# Patient Record
Sex: Male | Born: 1970 | Hispanic: No | Marital: Married | Smoking: Never smoker
Health system: Southern US, Community
[De-identification: ages and names within clinical notes are randomized; demographics above are authoritative.]

## PROBLEM LIST (undated history)

## (undated) DIAGNOSIS — I10 Essential (primary) hypertension: Secondary | ICD-10-CM

## (undated) DIAGNOSIS — E785 Hyperlipidemia, unspecified: Secondary | ICD-10-CM

---

## 1998-01-25 ENCOUNTER — Other Ambulatory Visit: Admission: RE | Admit: 1998-01-25 | Discharge: 1998-01-25 | Payer: Self-pay | Admitting: Dermatology

## 1998-02-21 ENCOUNTER — Other Ambulatory Visit: Admission: RE | Admit: 1998-02-21 | Discharge: 1998-02-21 | Payer: Self-pay | Admitting: Dermatology

## 2013-01-12 ENCOUNTER — Ambulatory Visit
Admission: RE | Admit: 2013-01-12 | Discharge: 2013-01-12 | Disposition: A | Discharge status: Home / Self Care | Payer: BC Managed Care – PPO | Source: Ambulatory Visit | Attending: *Deleted | Admitting: *Deleted

## 2013-01-12 ENCOUNTER — Other Ambulatory Visit: Payer: Self-pay | Admitting: *Deleted

## 2013-01-12 DIAGNOSIS — R05 Cough: Secondary | ICD-10-CM

## 2015-03-09 ENCOUNTER — Emergency Department (HOSPITAL_BASED_OUTPATIENT_CLINIC_OR_DEPARTMENT_OTHER)
Admission: EM | Admit: 2015-03-09 | Discharge: 2015-03-10 | Disposition: A | Discharge status: Home / Self Care | Payer: PRIVATE HEALTH INSURANCE | Attending: Emergency Medicine | Admitting: Emergency Medicine

## 2015-03-09 ENCOUNTER — Emergency Department (HOSPITAL_BASED_OUTPATIENT_CLINIC_OR_DEPARTMENT_OTHER): Payer: PRIVATE HEALTH INSURANCE

## 2015-03-09 ENCOUNTER — Encounter (HOSPITAL_BASED_OUTPATIENT_CLINIC_OR_DEPARTMENT_OTHER): Payer: Self-pay

## 2015-03-09 DIAGNOSIS — R05 Cough: Secondary | ICD-10-CM | POA: Insufficient documentation

## 2015-03-09 DIAGNOSIS — I10 Essential (primary) hypertension: Secondary | ICD-10-CM | POA: Diagnosis not present

## 2015-03-09 DIAGNOSIS — R109 Unspecified abdominal pain: Secondary | ICD-10-CM | POA: Diagnosis not present

## 2015-03-09 DIAGNOSIS — R509 Fever, unspecified: Secondary | ICD-10-CM | POA: Diagnosis present

## 2015-03-09 DIAGNOSIS — R197 Diarrhea, unspecified: Secondary | ICD-10-CM | POA: Diagnosis not present

## 2015-03-09 DIAGNOSIS — E785 Hyperlipidemia, unspecified: Secondary | ICD-10-CM | POA: Diagnosis not present

## 2015-03-09 DIAGNOSIS — R110 Nausea: Secondary | ICD-10-CM | POA: Diagnosis not present

## 2015-03-09 HISTORY — DX: Essential (primary) hypertension: I10

## 2015-03-09 HISTORY — DX: Hyperlipidemia, unspecified: E78.5

## 2015-03-09 LAB — URINALYSIS, ROUTINE W REFLEX MICROSCOPIC
Bilirubin Urine: NEGATIVE
Glucose, UA: NEGATIVE mg/dL
Hgb urine dipstick: NEGATIVE
KETONES UR: 15 mg/dL — AB
LEUKOCYTES UA: NEGATIVE
NITRITE: NEGATIVE
Protein, ur: NEGATIVE mg/dL
Specific Gravity, Urine: 1.019 (ref 1.005–1.030)
UROBILINOGEN UA: 0.2 mg/dL (ref 0.0–1.0)
pH: 6.5 (ref 5.0–8.0)

## 2015-03-09 LAB — CBC WITH DIFFERENTIAL/PLATELET
BASOS ABS: 0 10*3/uL (ref 0.0–0.1)
Basophils Relative: 0 % (ref 0–1)
EOS ABS: 0 10*3/uL (ref 0.0–0.7)
Eosinophils Relative: 0 % (ref 0–5)
HCT: 41 % (ref 39.0–52.0)
HEMOGLOBIN: 14.5 g/dL (ref 13.0–17.0)
LYMPHS ABS: 0.5 10*3/uL — AB (ref 0.7–4.0)
LYMPHS PCT: 6 % — AB (ref 12–46)
MCH: 31.7 pg (ref 26.0–34.0)
MCHC: 35.4 g/dL (ref 30.0–36.0)
MCV: 89.5 fL (ref 78.0–100.0)
Monocytes Absolute: 0.7 10*3/uL (ref 0.1–1.0)
Monocytes Relative: 9 % (ref 3–12)
NEUTROS PCT: 85 % — AB (ref 43–77)
Neutro Abs: 6 10*3/uL (ref 1.7–7.7)
PLATELETS: 168 10*3/uL (ref 150–400)
RBC: 4.58 MIL/uL (ref 4.22–5.81)
RDW: 12.4 % (ref 11.5–15.5)
WBC: 7.1 10*3/uL (ref 4.0–10.5)

## 2015-03-09 LAB — COMPREHENSIVE METABOLIC PANEL
ALBUMIN: 3.9 g/dL (ref 3.5–5.0)
ALT: 19 U/L (ref 17–63)
ANION GAP: 11 (ref 5–15)
AST: 27 U/L (ref 15–41)
Alkaline Phosphatase: 56 U/L (ref 38–126)
BILIRUBIN TOTAL: 0.8 mg/dL (ref 0.3–1.2)
BUN: 12 mg/dL (ref 6–20)
CALCIUM: 8.8 mg/dL — AB (ref 8.9–10.3)
CO2: 23 mmol/L (ref 22–32)
Chloride: 98 mmol/L — ABNORMAL LOW (ref 101–111)
Creatinine, Ser: 0.93 mg/dL (ref 0.61–1.24)
GFR calc non Af Amer: >60 mL/min (ref >60)
Glucose, Bld: 114 mg/dL — ABNORMAL HIGH (ref 65–99)
Potassium: 3.7 mmol/L (ref 3.5–5.1)
SODIUM: 132 mmol/L — AB (ref 135–145)
TOTAL PROTEIN: 6.4 g/dL — AB (ref 6.5–8.1)

## 2015-03-09 MED ORDER — IBUPROFEN 800 MG PO TABS
800.0000 mg | ORAL_TABLET | Freq: Once | ORAL | Status: AC
  Administered 2015-03-09: 800 mg via ORAL
  Filled 2015-03-09: qty 1

## 2015-03-09 MED ORDER — SODIUM CHLORIDE 0.9 % IV BOLUS (SEPSIS)
1000.0000 mL | Freq: Once | INTRAVENOUS | Status: AC
  Administered 2015-03-09: 1000 mL via INTRAVENOUS

## 2015-03-09 MED ORDER — ACETAMINOPHEN 500 MG PO TABS
1000.0000 mg | ORAL_TABLET | Freq: Once | ORAL | Status: AC
Start: 2015-03-09 — End: 2015-03-10
  Administered 2015-03-10: 1000 mg via ORAL
  Filled 2015-03-09: qty 2

## 2015-03-09 MED ORDER — SODIUM CHLORIDE 0.9 % IV BOLUS (SEPSIS)
1000.0000 mL | Freq: Once | INTRAVENOUS | Status: AC
  Administered 2015-03-10: 1000 mL via INTRAVENOUS

## 2015-03-09 NOTE — ED Notes (Addendum)
Pt reports diarrhea, feeling tired, fatigued, muscle tremors, fever x1 day. Pt reports he recently traveled back from [place] today after 4 days. Reports insect bite to left foot. Pt reports Tylenol 2 hours ago, Motrin 4 hours ago.

## 2015-03-09 NOTE — Discharge Instructions (Signed)
Fever, Adult °A fever is a higher than normal body temperature. In an adult, an oral temperature around 98.6° F (37° C) is considered normal. A temperature of 100.4° F (38° C) or higher is generally considered a fever. Mild or moderate fevers generally have no long-term effects and often do not require treatment. Extreme fever (greater than or equal to 106° F or 41.1° C) can cause seizures. The sweating that may occur with repeated or prolonged fever may cause dehydration. Elderly people can develop confusion during a fever. °A measured temperature can vary with: °· Age. °· Time of day. °· Method of measurement (mouth, underarm, rectal, or ear). °The fever is confirmed by taking a temperature with a thermometer. Temperatures can be taken different ways. Some methods are accurate and some are not. °· An oral temperature is used most commonly. Electronic thermometers are fast and accurate. °· An ear temperature will only be accurate if the thermometer is positioned as recommended by the manufacturer. °· A rectal temperature is accurate and done for those adults who have a condition where an oral temperature cannot be taken. °· An underarm (axillary) temperature is not accurate and not recommended. °Fever is a symptom, not a disease.  °CAUSES  °· Infections commonly cause fever. °· Some noninfectious causes for fever include: °· Some arthritis conditions. °· Some thyroid or adrenal gland conditions. °· Some immune system conditions. °· Some types of cancer. °· A medicine reaction. °· High doses of certain street drugs such as methamphetamine. °· Dehydration. °· Exposure to high outside or room temperatures. °· Occasionally, the source of a fever cannot be determined. This is sometimes called a "fever of unknown origin" (FUO). °· Some situations may lead to a temporary rise in body temperature that may go away on its own. Examples are: °· Childbirth. °· Surgery. °· Intense exercise. °HOME CARE INSTRUCTIONS  °· Take  appropriate medicines for fever. Follow dosing instructions carefully. If you use acetaminophen to reduce the fever, be careful to avoid taking other medicines that also contain acetaminophen. Do not take aspirin for a fever if you are younger than age 19. There is an association with Reye's syndrome. Reye's syndrome is a rare but potentially deadly disease. °· If an infection is present and antibiotics have been prescribed, take them as directed. Finish them even if you start to feel better. °· Rest as needed. °· Maintain an adequate fluid intake. To prevent dehydration during an illness with prolonged or recurrent fever, you may need to drink extra fluid. Drink enough fluids to keep your urine clear or pale yellow. °· Sponging or bathing with room temperature water may help reduce body temperature. Do not use ice water or alcohol sponge baths. °· Dress comfortably, but do not over-bundle. °SEEK MEDICAL CARE IF:  °· You are unable to keep fluids down. °· You develop vomiting or diarrhea. °· You are not feeling at least partly better after 3 days. °· You develop new symptoms or problems. °SEEK IMMEDIATE MEDICAL CARE IF:  °· You have shortness of breath or trouble breathing. °· You develop excessive weakness. °· You are dizzy or you faint. °· You are extremely thirsty or you are making little or no urine. °· You develop new pain that was not there before (such as in the head, neck, chest, back, or abdomen). °· You have persistent vomiting and diarrhea for more than 1 to 2 days. °· You develop a stiff neck or your eyes become sensitive to light. °· You develop a   skin rash.  You have a fever or persistent symptoms for more than 2 to 3 days.  You have a fever and your symptoms suddenly get worse. MAKE SURE YOU:   Understand these instructions.  Will watch your condition.  Will get help right away if you are not doing well or get worse. Document Released: 01/09/2001 Document Revised: 11/30/2013 Document  Reviewed: 05/17/2011 [facility] Patient Information 2015 [place], [place]. This information is not intended to replace advice given to you by your health care provider. Make sure you discuss any questions you have with your health care provider. Traveling, Food and Drink Risks Unclean or not pure (contaminated) food and drink are common sources for bringing infection into the body, especially the digestive system (gastroenteritis). This can cause nausea, stomach cramping, diarrhea (with or without visible blood) and/or vomiting. Some of the common infections that travelers can get are:  Escherichia coli infections (E. coli).  Shigellosis or bacillary dysentery.  Giardiasis.  Campylobacter jejuni infections.  Cryptosporidiosis.  Hepatitis A.  Amebiasis. Other less common infectious disease risks for travelers include:  Typhoid fever and other salmonelloses.  Cholera.  Viral infections caused by rotavirus and noroviruses.  Various protozoan and helminthic (worm-like) parasites. Many of the infectious diseases transmitted in food and water can also be acquired when solid body wastes, or feces, contaminate food (fecal-oral route). WHAT IS TRAVELERS' DIARRHEA? The most common travel health problem is travelers' diarrhea. Between 20% and 50% of international travelers develop diarrhea each year. It often occurs in the first week of travel. However, it may occur at any time while traveling, or after returning home. The world is divided into three grades of risk:  Low-risk: [place], [place], [place], [place], [place], countries in [place] and [place].  Intermediate-risk: [place], [place], some of the [place] islands.  High-risk: Most of [place], [place], [place], [place], [place] and [place]. In high risk places, often many people do not have access to plumbing or outhouses. The amount of contaminated stool is high and more open to flies. Poor electricity  capacity can cause blackouts. This affects refrigeration, which may cause unsafe food storage. Lack of water supplies may mean a lack of sinks for hand washing by restaurant staff. People at greater risk include young adults, and those with low immune response. This includes people with HIV/AIDS, or those taking medicines to decrease immunity. Also, people with inflammatory-bowel disease or diabetes, and people taking stomach medicines that reduce acid level (H-2 blockers or antacids). The leading cause of infection is consuming food or water contaminated with feces. Poor hygiene practice in local restaurants is thought to be the largest cause of travelers' diarrhea. The bacteria or germ that most often causes this illness is E. coli. WHAT ARE THE SYMPTOMS OF TRAVELERS' DIARRHEA? The illness often begins suddenly. It causes an increase in frequency, volume, and weight of stool. An affected person often has 4 to 5 loose, watery bowel movements each day. Other symptoms include nausea, vomiting, diarrhea, stomach cramping, bloating, fever, urgency, and general ill feeling (malaise). Most cases are not serious and go away in 1-2 days without treatment. Travelers' diarrhea is rarely life-threatening. (90% of cases resolve in 1 week. 98% resolve in 1 month.) PREVENTING TRAVELERS' DIARRHEA Reduce your exposure to potentially not pure water. Water that is chlorinated, using minimum water treatment standards used in the [place] protects against viral and bacterial (germs) diseases. Chlorine treatment alone might not kill some enteric (gut) viruses. It may not kill  all infection causing parasites. Where chlorinated tap water is not available, or where hygiene and sanitation are poor, only the following might be safe to drink:   Beverages made with boiled water (tea, coffee).  Canned or bottled carbonated drinks (carbonated bottled water, soft drinks).  Beer.  Wine. When buying carbonated drinks or bottled water,  always inspect the bottle seal. Make sure it has not been opened. This could mean it was refilled with unclean beverages. If you suspect a bottle seal has been tampered with, return or discard it.  Where water might be contaminated, ice could be, also. Ice should not be used in beverages. If ice comes in contact with containers used for drinking, discard the ice. Thoroughly clean the containers, preferably with soap and hot water.  It is safer to drink a beverage directly from the can or bottle than from a questionable container. However, water on the outside of cans or bottles might not be pure. Dry wet cans or bottles before they are opened. Wipe clean the surfaces where your mouth will have contact. Also, avoid brushing your teeth with tap water.  PREVENTIVE TREATMENT OF WATER  The following methods can be used to treat water. This makes it safe for drinking and other purposes.   Boiling. This is the best method. Bring water to a rolling boil for 1 minute. Then allow it to cool to room temperature. Ice should not be added. Boiling will kill bacterial and parasitic causes of diarrhea at all altitudes. It will kill viruses at low altitudes. To kill viruses at altitudes above 2,000 meters (6,562 feet), water should be boiled for 3 minutes. Or chemical disinfection should be used after the water has boiled for 1 minute. To improve taste, add a pinch of salt to each quart. Or pour the water several times from one clean container to another.  Chemical disinfection. Iodine can be used, when boiling is not possible. However, this method might not kill all parasites, unless the water sits for 15 hours before it is drunk. Two well-tested methods for disinfection with iodine are the use of:  Tincture of iodine.  Tetraglycine hydroperiodide tablets. Examples are Globaline, Potable-Aqua, or Coghlan's. You can find these tablets at pharmacies and sporting goods stores. Follow the instructions on the label. If  water is cloudy, double the number of tablets used. If water is extremely cold (less than 5 Celsius or 41 Fahrenheit), try to warm it. Increase the advised contact time to achieve reliable disinfection. Cloudy water should be strained through a clean cloth into a container. This should remove floating matter. Then the water should be boiled. Or it can be treated with iodine. Chlorine, in various forms, can also be used for chemical disinfection. But its germ killing activity varies greatly with the acidity (pH), temperature, and content of the water. Chemically treated water is meant for short-term use only. Use iodine disinfected water for only a few weeks.   Portable filters provide various degrees of protection. Reverse-osmosis filters protect against viruses, bacteria (germs), and protozoa. However, they are expensive and large. The small pores on this type of filter get quickly plugged by muddy or cloudy water. The membranes in some filters can be damaged by chlorine. Microstrainer filters (0.1- to 0.3-micrometers), can remove bacteria and protozoa. But they do not remove viruses. To kill viruses, travelers should disinfect the water with iodine or chlorine after filtration. Filters with iodine-impregnated resins work best against bacteria. The iodine will kill some viruses. But the  contact time with the iodine in the filter is too short to kill some parasites. To produce safe water, proper selection, operation, care, and maintenance of water filters is needed. Follow the instructions on the label.  As a last resort, if safe drinking water is not available, very hot tap water might be safer than cold tap water. But proper disinfection, filtering, or boiling is still advised. REDUCING RISK FROM FOOD  Reduce your exposure to potentially contaminated food. To avoid illness, travelers should select food with care. All raw food could be contaminated. Especially where hygiene and sanitation are poor,  avoid:  Salads.  Uncooked vegetables.  Unpeeled fruits or vegetables.  Unpasteurized milk and milk products, such as cheese.  Undercooked and raw meat, fish, and shellfish.  Eat only food that has been cooked and is still hot.  Eat fruit that has been prepared by a food service provider who routinely caters to foreign travelers.  Cooked food that stands for hours at room temperature can have bacterial growth. It should be thoroughly reheated before serving. Avoid foods that may have been sitting for some time in the kitchen or in a buffet.  Do not eat food purchased from street vendors. Consuming food and beverages purchased from street vendors has been linked to increased risk of illness.  Eat at restaurants that often cater to foreign travelers (leading hotels, hotel chains).  To guarantee safe food for an infant younger than 37 months of age, breastfeed. If the infant has already been weaned from the breast, formula prepared from commercial powder and boiled water is the safest food.  Carry small containers of hand-sanitizing solutions or gels (with at least 60% alcohol). This makes it easier to clean your hands before eating.  Some fish and shellfish contain poisonous biotoxins (such as ciguatoxin), even when cooked. [place] is the most toxin filled. It should always be avoided. Red snapper, grouper, amber jack, sea bass, and other tropical reef fish contain the toxin at various times. Poisoning potential exists in all areas where those fish are eaten. Especially in subtropical and [address] areas of the [place], [place], and [place]. Symptoms of this poisoning include gastroenteritis. That is followed by:  Neurologic problems, such as dysesthesias (impaired senses, especially touch).  Temperature reversal.  Weakness.  Rarely, low blood pressure (hypotension).  Scombroid is another common fish poisoning. It occurs worldwide in tropical and temperate regions.  Fish of the [place] family (bluefin, yellow fin tuna, mackerel, bonito), and some non-scombroid fish (mahi [place], herring, amber [place], [place]) may contain high levels of histidine. With poor refrigeration or preservation, histidine turns into histamine. This can cause:  Flushing (becoming red faced).  Headache.  Feeling sick to your stomach (nausea).  Vomiting.  Diarrhea.  Hives (urticaria). Cholera has occurred in people who ate crab brought back from [place]. Travelers should not bring perishable seafood with them when they return to the [place] TREATMENT OF TRAVELERS' DIARRHEA  This illness often goes away without treatment. Oral rehydration (giving the body safe water and added packets of salt and sugar mixtures) can replace lost fluids and chemicals in the blood (electrolytes). This is especially important in children and people with longstanding (chronic) diseases. For severe fluid loss, the best replacement is with oral rehydration solutions (ORS), such as the WHO ORS solutions. These are widely available at stores and pharmacies in most developing countries.  When symptoms first occur, stop eating, and only drink clear liquids (only for adults) to help quiet  the stomach. Travelers who develop 3 or more loose stools in an 8-hour period may benefit from antibiotic medicine. Especially if you also have nausea, vomiting, stomach cramps, fever, or blood in stools. Antibiotics are often given for 3-5 days. Some caregivers will prescribe antibiotics for people who are traveling to high risk places, to be used if and when symptoms begin. If diarrhea continues despite treatment, travelers should be evaluated by a caregiver and treated for possible parasitic infection. Control of frequent diarrhea is possible with over-the-counter medicines called anti-motility agents. These drugs reduce the amount of diarrhea by slowing transit time in the stomach. This allows more time for food and water to  be absorbed. It is thought that diarrhea may be a defense mechanism the body uses, to reduce the contact time between infection causing germs and the stomach. Anti-motility drugs decrease the duration of diarrhea. But they should never be used by travelers with fever or bloody diarrhea. Such drugs can increase the severity of disease by slowing the removal of germs from the body. If excessively or not properly taken, anti-motility agents can cause serious illness on their own. Studies have found that people who follow the above eating rules still get ill sometimes. Antibiotic prevention before infection occurs is not advised for most travelers. Exceptions include those who have a high risk of serious health problems if treated after infection. However, studies have shown that the active ingredient in Pepto-Bismol (bismuth subsalicylate, BSS), taken regularly while traveling, can offer some protection against travelers' diarrhea. Ask your caregiver about this option before traveling, if desired. Do not use in children. This Information Courtesy of CDC. Document Released: 10/06/2002 Document Revised: 10/08/2011 Document Reviewed: 10/23/2013 [employer] Patient Information 2015 [place], [place]. This information is not intended to replace advice given to you by your health care provider. Make sure you discuss any questions you have with your health care provider.

## 2015-03-09 NOTE — ED Provider Notes (Signed)
CSN: [id number]     Arrival date & time 03/09/15  2144 History   First MD Initiated Contact with Patient 03/09/15 2202     Chief Complaint  Patient presents with  . Insect Bite     (Consider location/radiation/quality/duration/timing/severity/associated sxs/prior Treatment) Patient is a 44 y.o. male presenting with general illness.  Illness Location:  Generalized Quality:  Fatigue, malaise, fever, diarrhea Severity:  Moderate Onset quality:  Gradual Duration:  1 day Timing:  Constant Progression:  Unchanged Chronicity:  New Context:  Return from [place] today, had respiratory illness prior to travel to [place] Relieved by:  Nothing Worsened by:  Nothing Associated symptoms: abdominal pain (crampy with BM), cough, diarrhea, fever and nausea   Associated symptoms: no chest pain, no headaches, no rhinorrhea, no shortness of breath and no vomiting     Past Medical History  Diagnosis Date  . Hypertension   . Hyperlipidemia    History reviewed. No pertinent past surgical history. History reviewed. No pertinent family history. Social History  Substance Use Topics  . Smoking status: Never Smoker   . Smokeless tobacco: None  . Alcohol Use: No    Review of Systems  Constitutional: Positive for fever.  HENT: Negative for rhinorrhea.   Respiratory: Positive for cough. Negative for shortness of breath.   Cardiovascular: Negative for chest pain.  Gastrointestinal: Positive for nausea, abdominal pain (crampy with BM) and diarrhea. Negative for vomiting.  Neurological: Negative for headaches.  All other systems reviewed and are negative.     Allergies  Review of patient's allergies indicates no known allergies.  Home Medications   Prior to Admission medications   Medication Sig Start Date End Date Taking? Authorizing Provider  LISINOPRIL-HYDROCHLOROTHIAZIDE PO Take by mouth.   Yes Historical Provider, MD  PRAVASTATIN SODIUM PO Take by mouth.   Yes Historical Provider, MD   BP  111/85 mmHg  Pulse 111  Temp(Src) 103 F (39.4 C) (Oral)  Resp 16  Ht 6\' 2"  (1.88 m)  Wt 184 lb 4.8 oz (83.598 kg)  BMI 23.65 kg/m2  SpO2 98% Physical Exam  Constitutional: He is oriented to person, place, and time. He appears well-developed and well-nourished.  HENT:  Head: Normocephalic and atraumatic.  Eyes: Conjunctivae and EOM are normal.  Neck: Normal range of motion. Neck supple.  Cardiovascular: Normal rate, regular rhythm and normal heart sounds.   Pulmonary/Chest: Effort normal and breath sounds normal. No respiratory distress.  Abdominal: He exhibits no distension. There is no tenderness. There is no rebound and no guarding.  Musculoskeletal: Normal range of motion.  Neurological: He is alert and oriented to person, place, and time.  Skin: Skin is warm and dry.  Vitals reviewed.   ED Course  Procedures (including critical care time) Labs Review Labs Reviewed  CBC WITH DIFFERENTIAL/PLATELET - Abnormal; Notable for the following:    Neutrophils Relative % 85 (*)    Lymphocytes Relative 6 (*)    Lymphs Abs 0.5 (*)    All other components within normal limits  COMPREHENSIVE METABOLIC PANEL - Abnormal; Notable for the following:    Sodium 132 (*)    Chloride 98 (*)    Glucose, Bld 114 (*)    Calcium 8.8 (*)    Total Protein 6.4 (*)    All other components within normal limits  URINALYSIS, ROUTINE W REFLEX MICROSCOPIC (NOT AT [facility])    Imaging Review No results found.   EKG Interpretation None      MDM   Final diagnoses:  Fever, unspecified fever cause    44 y.o. male with pertinent PMH of HTN presents with fever, cough, diarrhea, nausea in context of recent travel to [place]. Patient denies chest pain or shortness of breath. In [place] he was bit by an insect in his shoe, no signs of infection or trauma of the foot. Unable to see where the insect bit him. He also was exposed to multiple flying insects but does not remember specific bite. On arrival today vitals  signs and physical exam as above. Patient mildly tachycardic, however also febrile. He has no rash, is well-appearing. Workup obtained as above, no elevation in white blood cell count or other abnormalities. Will check chest x-ray and CMP. Doubt malaria as it does not appear prevalent in [place]. Similarly doubt this is a virus or chikungunya given that the patient has no other symptoms.  Do not feel treatment warranted for traveler's diarrhea given one day of symptoms and otherwise well-appearing patient in area that should not be endemic with same.  Patient care to Dr. Read Drivers pending return of labs..    I have reviewed all laboratory and imaging studies if ordered as above  1. Fever, unspecified fever cause         Mirian Mo, MD 03/09/15 [phone]

## 2015-03-10 MED ORDER — ONDANSETRON HCL 4 MG/2ML IJ SOLN
4.0000 mg | Freq: Once | INTRAMUSCULAR | Status: AC
  Administered 2015-03-10: 4 mg via INTRAVENOUS
  Filled 2015-03-10: qty 2

## 2015-03-10 NOTE — ED Notes (Signed)
Pt ambulating independently w/ steady gait on d/c in no acute distress, A&Ox4.D/c instructions reviewed w/ pt and family - pt and family deny any further questions or concerns at present.  

## 2016-03-06 ENCOUNTER — Encounter: Payer: Self-pay | Admitting: Internal Medicine

## 2016-03-06 ENCOUNTER — Other Ambulatory Visit: Payer: Self-pay | Admitting: Family Medicine

## 2016-03-06 DIAGNOSIS — R1310 Dysphagia, unspecified: Secondary | ICD-10-CM

## 2016-03-14 ENCOUNTER — Ambulatory Visit
Admission: RE | Admit: 2016-03-14 | Discharge: 2016-03-14 | Disposition: A | Discharge status: Home / Self Care | Payer: PRIVATE HEALTH INSURANCE | Source: Ambulatory Visit | Attending: Family Medicine | Admitting: Family Medicine

## 2016-03-14 DIAGNOSIS — R1310 Dysphagia, unspecified: Secondary | ICD-10-CM

## 2016-05-16 ENCOUNTER — Ambulatory Visit: Payer: PRIVATE HEALTH INSURANCE | Admitting: Internal Medicine

## 2019-12-30 ENCOUNTER — Other Ambulatory Visit: Payer: Self-pay | Admitting: Family Medicine

## 2019-12-30 ENCOUNTER — Other Ambulatory Visit: Payer: Self-pay

## 2019-12-30 ENCOUNTER — Ambulatory Visit
Admission: RE | Admit: 2019-12-30 | Discharge: 2019-12-30 | Disposition: A | Discharge status: Home / Self Care | Payer: BC Managed Care – PPO | Source: Ambulatory Visit | Attending: Family Medicine | Admitting: Family Medicine

## 2019-12-30 DIAGNOSIS — R059 Cough, unspecified: Secondary | ICD-10-CM

## 2020-10-22 IMAGING — CR DG CHEST 2V
2 series · 2 of 2 positions shown · non-contrast
Comparison: 03/09/2015

CLINICAL DATA: Cough for 4 months,  hypertension

EXAM:
CHEST - 2 VIEW

[w chest pa]
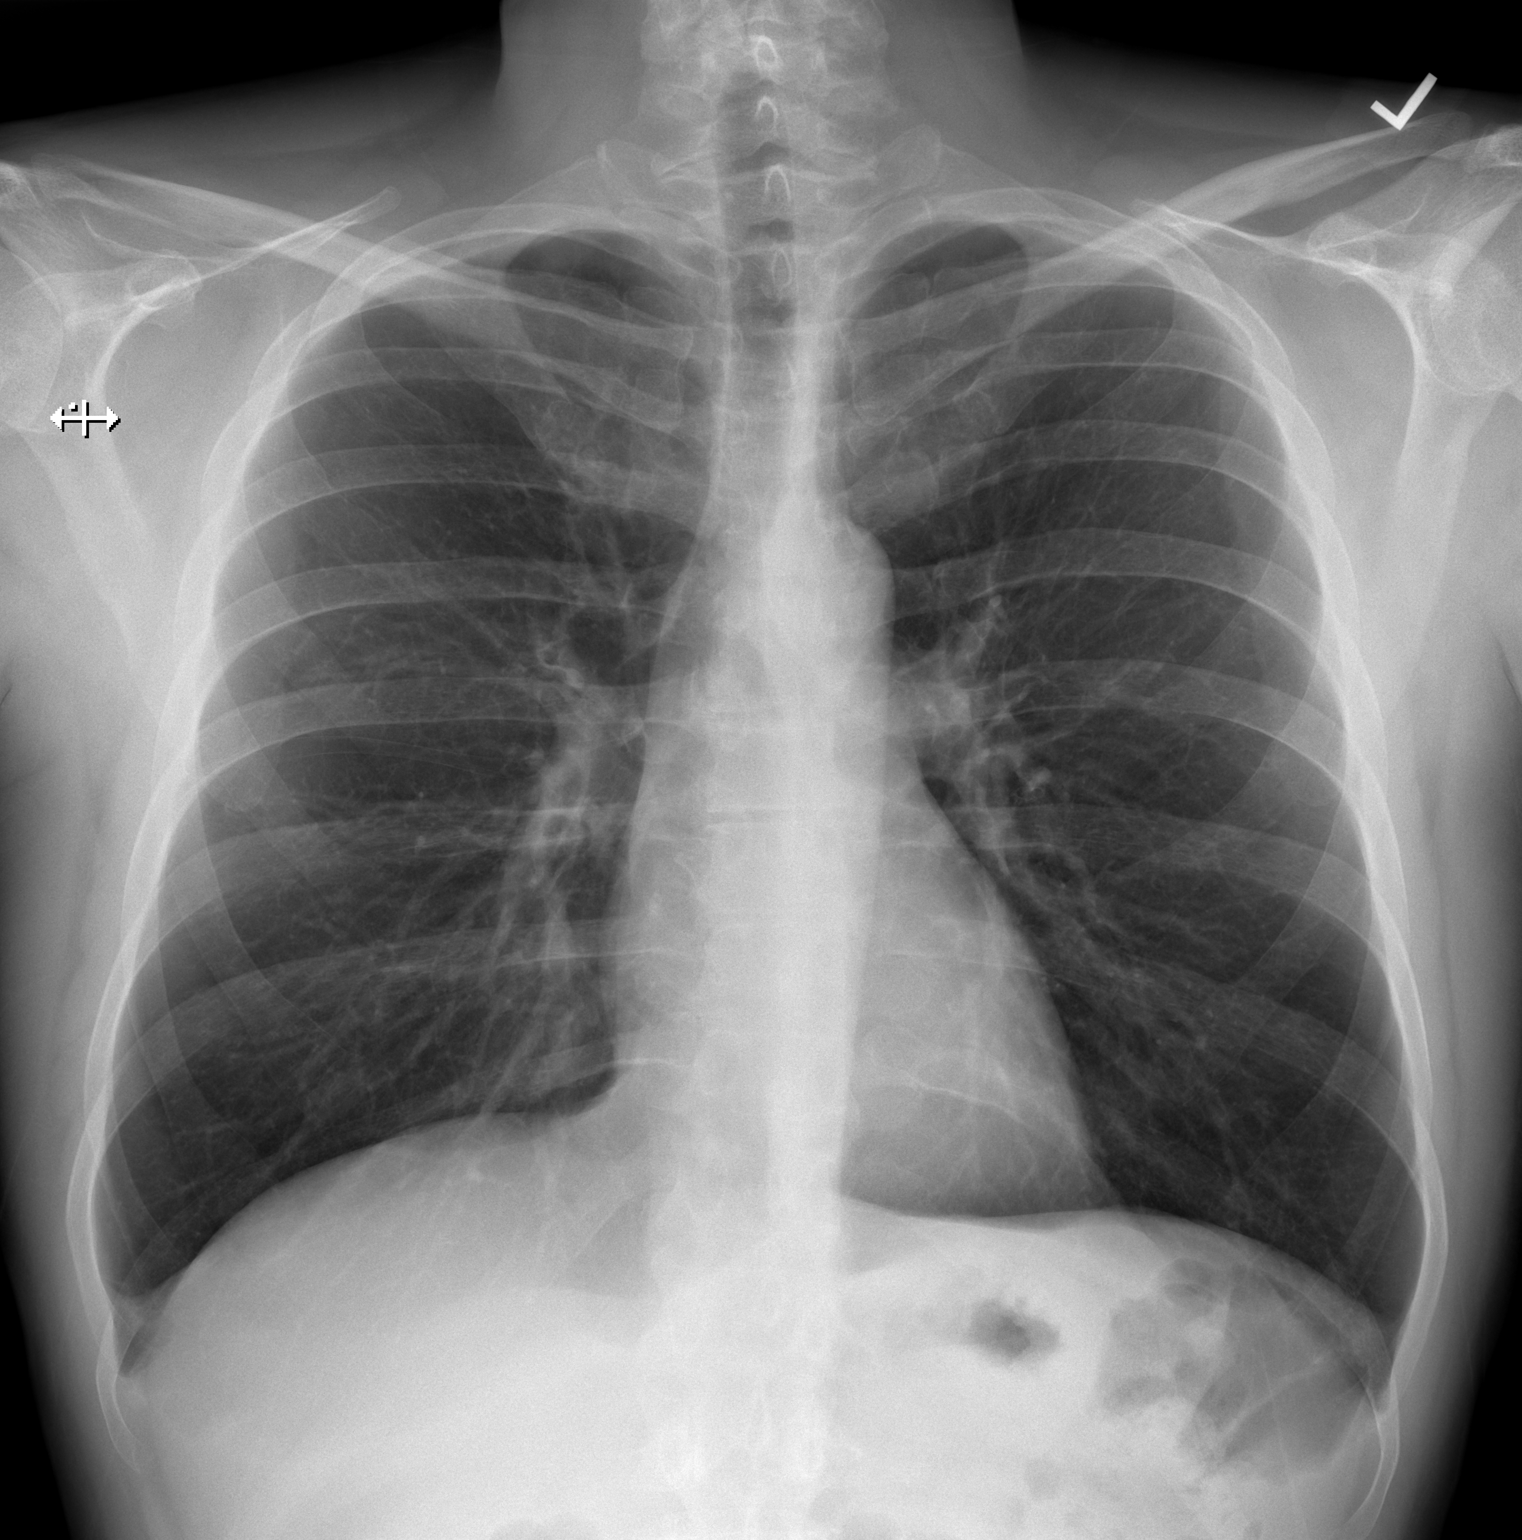

[w chest lat]
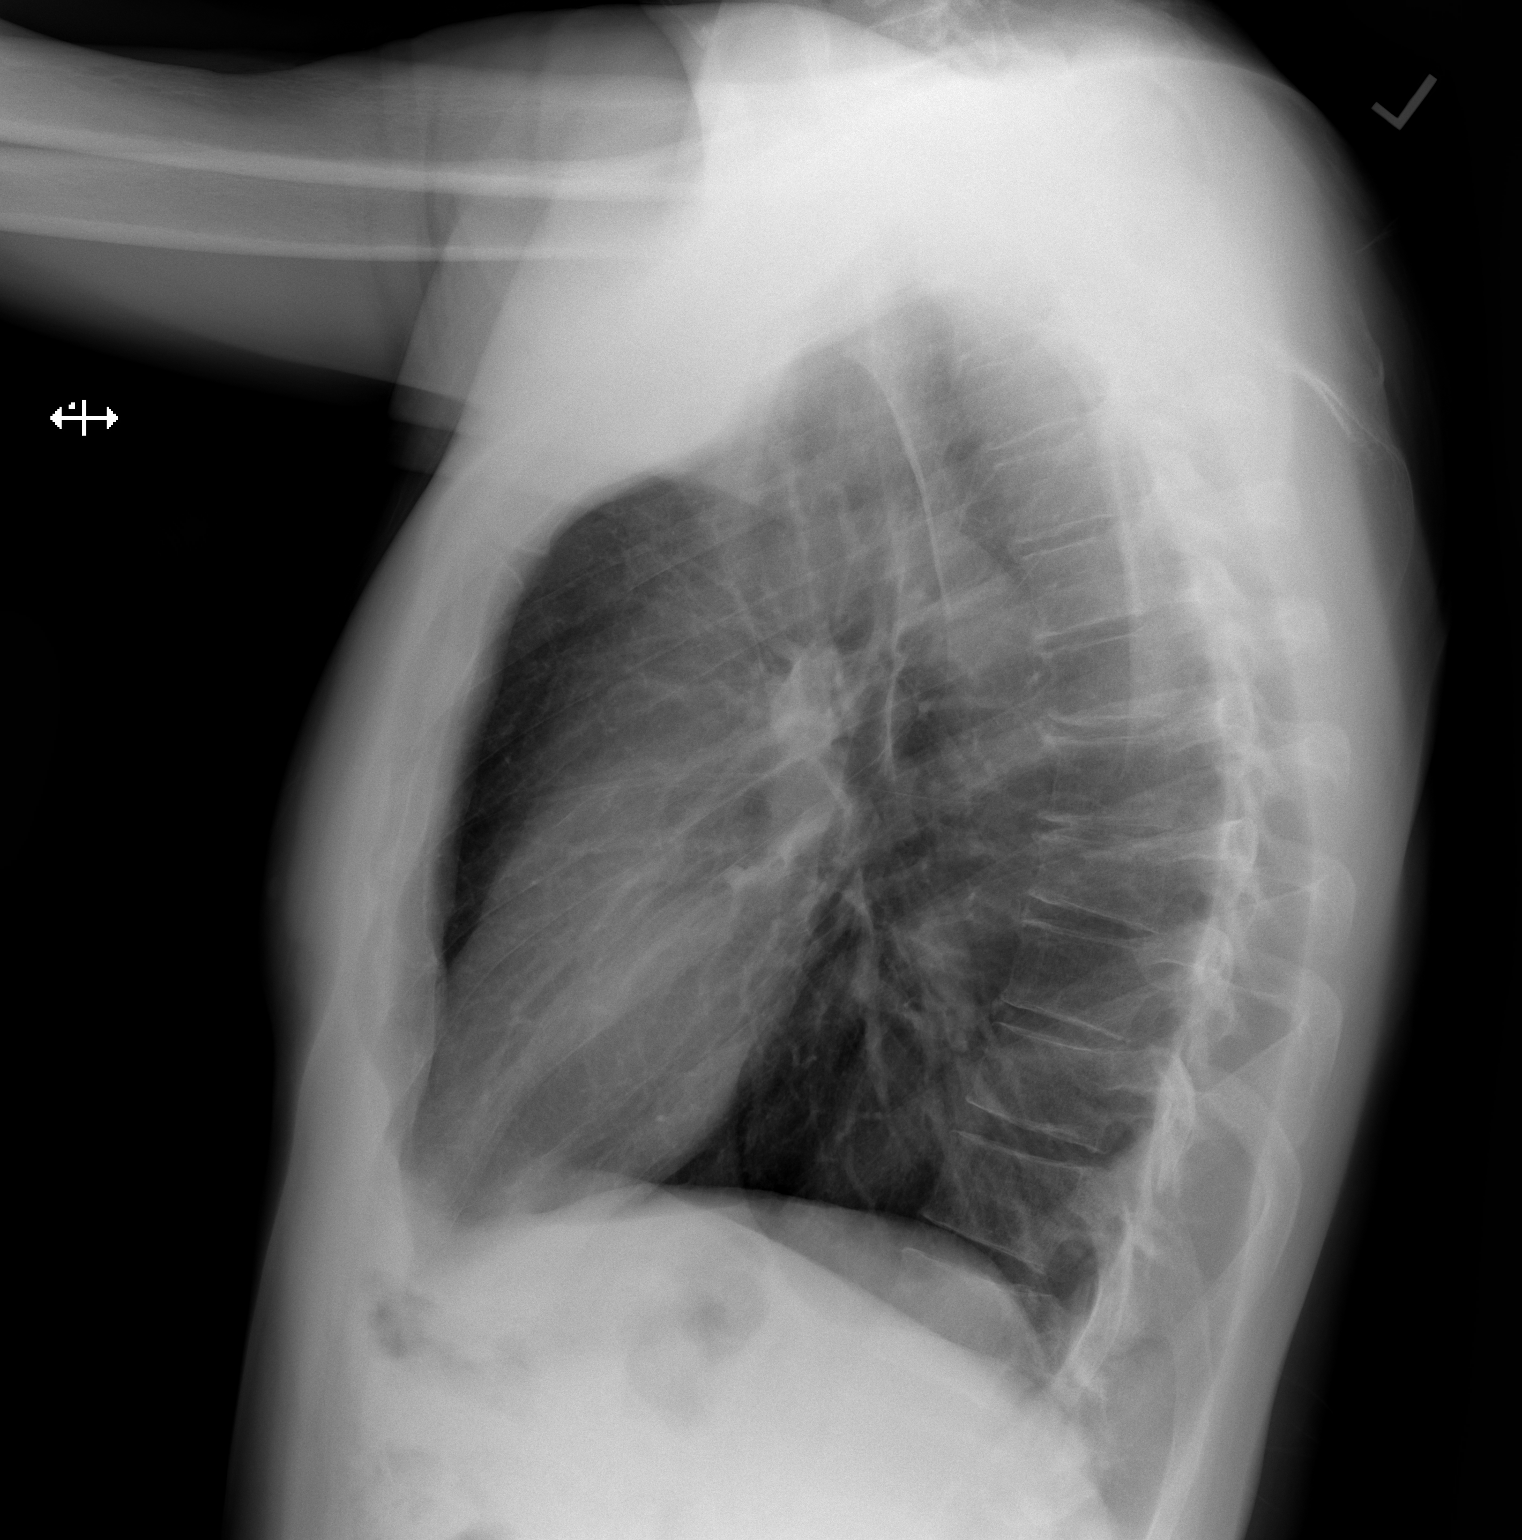

[2 of 2 positions shown; findings below may reference images not displayed]

FINDINGS: Normal heart size, mediastinal contours, and pulmonary vascularity.

Lungs clear.

No pulmonary infiltrate, pleural effusion or pneumothorax.

Minimal chronic height loss of a midthoracic vertebra.

No acute osseous findings.
IMPRESSION: No acute abnormalities.
